# Patient Record
Sex: Female | Born: 1955 | Race: White | Hispanic: No | Marital: Single | Smoking: Never smoker
Health system: Southern US, Community
[De-identification: ages and names within clinical notes are randomized; demographics above are authoritative.]

## PROBLEM LIST (undated history)

## (undated) DIAGNOSIS — B019 Varicella without complication: Secondary | ICD-10-CM

## (undated) HISTORY — PX: TONSILLECTOMY: SUR1361

## (undated) HISTORY — PX: RECONSTRUCTION OF EYELID: SHX6576

## (undated) HISTORY — DX: Varicella without complication: B01.9

---

## 2009-03-27 LAB — HM PAP SMEAR: HM Pap smear: NORMAL

## 2009-03-27 LAB — HM MAMMOGRAPHY: HM Mammogram: NORMAL

## 2014-11-03 ENCOUNTER — Encounter: Payer: Self-pay | Admitting: *Deleted

## 2014-11-03 ENCOUNTER — Telehealth: Payer: Self-pay | Admitting: *Deleted

## 2014-11-03 NOTE — Telephone Encounter (Signed)
Pre-Visit Call completed with patient and chart updated.   Pre-Visit Info documented in Specialty Comments under [employer].    

## 2014-11-04 ENCOUNTER — Ambulatory Visit (INDEPENDENT_AMBULATORY_CARE_PROVIDER_SITE_OTHER): Payer: BLUE CROSS/BLUE SHIELD | Admitting: Physician Assistant

## 2014-11-04 ENCOUNTER — Encounter: Payer: Self-pay | Admitting: Physician Assistant

## 2014-11-04 VITALS — BP 126/64 | HR 76 | Temp 98.0°F | Resp 16 | Ht 65.0 in | Wt 140.2 lb

## 2014-11-04 DIAGNOSIS — Z0000 Encounter for general adult medical examination without abnormal findings: Secondary | ICD-10-CM | POA: Diagnosis not present

## 2014-11-04 DIAGNOSIS — Z23 Encounter for immunization: Secondary | ICD-10-CM | POA: Diagnosis not present

## 2014-11-04 DIAGNOSIS — Z1239 Encounter for other screening for malignant neoplasm of breast: Secondary | ICD-10-CM | POA: Insufficient documentation

## 2014-11-04 NOTE — Progress Notes (Signed)
Patient presents to clinic today to establish care. Is requesting CPE. Is fasting for labs.  Patient is followed by Dr. Truddie Crumble with [facility]. Endorses last PAP > 5 years without abnormal findings. Last Mammogram > 5 years ago. Endorses was normal. Has + family history of breast cancer with mother diagnosed in her 58s.  Endorses last Colonoscopy > 10 years ago without abnormal findings without abnormal findings.   Is followed by Optometry for annual eye exams. Wears contacts daily. Is up to date on dental care.  Is due for tetanus. Will be getting today.  Past Medical History  Diagnosis Date  . Chicken pox   . UTI (lower urinary tract infection)     Current Outpatient Prescriptions on File Prior to Visit  Medication Sig Dispense Refill  . cholecalciferol (VITAMIN D) 1000 UNITS tablet Take 2,000 Units by mouth daily.    . Cod Liver Oil OIL Take 1 Dose by mouth daily. 1 tsp daily     No current facility-administered medications on file prior to visit.    No Known Allergies  Family History  Problem Relation Age of Onset  . Hypertension Mother   . Cancer Mother 7    breast   . Heart disease Mother   . Hypertension Father   . Heart disease Father     Social History   Social History  . Marital Status: Single    Spouse Name: N/A  . Number of Children: N/A  . Years of Education: N/A   Social History Main Topics  . Smoking status: None  . Smokeless tobacco: None  . Alcohol Use: None  . Drug Use: None  . Sexual Activity: Not Asked   Other Topics Concern  . None   Social History Narrative   Review of Systems  Constitutional: Negative for fever and weight loss.  HENT: Negative for ear discharge, ear pain, hearing loss and tinnitus.   Eyes: Negative for blurred vision, double vision, photophobia and pain.  Respiratory: Negative for cough and shortness of breath.   Cardiovascular: Negative for chest pain and palpitations.  Gastrointestinal: Negative for  heartburn, nausea, vomiting, abdominal pain, diarrhea, constipation, blood in stool and melena.  Genitourinary: Negative for dysuria, urgency, frequency, hematuria and flank pain.  Musculoskeletal: Negative for falls.  Neurological: Negative for dizziness, loss of consciousness and headaches.  Endo/Heme/Allergies: Negative for environmental allergies.  Psychiatric/Behavioral: Negative for depression, suicidal ideas, hallucinations and substance abuse. The patient is not nervous/anxious and does not have insomnia.      BP 126/64 mmHg  Pulse 76  Temp(Src) 98 F (36.7 C) (Oral)  Resp 16  Ht  (1.651 m)  Wt 140 lb 3.2 oz (63.594 kg)  BMI 23.33 kg/m2  SpO2 98%  Physical Exam  Constitutional: She is oriented to person, place, and time and well-developed, well-nourished, and in no distress.  HENT:  Head: Normocephalic and atraumatic.  Right Ear: Tympanic membrane, external ear and ear canal normal.  Left Ear: Tympanic membrane, external ear and ear canal normal.  Nose: Nose normal. No mucosal edema.  Mouth/Throat: Uvula is midline, oropharynx is clear and moist and mucous membranes are normal. No oropharyngeal exudate or posterior oropharyngeal erythema.  Eyes: Conjunctivae are normal. Pupils are equal, round, and reactive to light.  Neck: Neck supple. No thyromegaly present.  Cardiovascular: Normal rate, regular rhythm, normal heart sounds and intact distal pulses.   Pulmonary/Chest: Effort normal and breath sounds normal. No respiratory distress. She has no wheezes. She has  no rales.  Abdominal: Soft. Bowel sounds are normal. She exhibits no distension and no mass. There is no tenderness. There is no rebound and no guarding.  Lymphadenopathy:    She has no cervical adenopathy.  Neurological: She is alert and oriented to person, place, and time. No cranial nerve deficit.  Skin: Skin is warm and dry. No rash noted.  Psychiatric: Affect normal.  Vitals  reviewed.  Assessment/Plan: Breast cancer screening Order for screening mammogram placed in patient with + family hx of breast cancer.  Visit for preventive health examination Depression screen negative. Health Maintenance reviewed -- Overdue for Tetanus, PAP, Mammogram and Colonoscopy. Will prioritize completing these. TDaP updated. Mammogram ordered. Patient to schedule visit for pelvic exam and PAP. Will then proceed with Colonoscopy. Preventive schedule discussed and handout given in AVS. Will obtain fasting labs today.  Need for Tdap vaccination TDaP given by nursing staff.

## 2014-11-04 NOTE — Patient Instructions (Signed)
Please go to the lab for blood work. I will call you with your results. If all is good we will follow-up yearly for physicals and whenever you need me. If anything is abnormal we will treat accordingly and get you in for a follow-up visit.  Your Tetanus is good for 10 years. You will be contacted for a screening mammogram.  Please schedule an appointment for routine breast/pelvic exam with a PAP smear. I can always refer you to gynecology for this if you prefer.  Our goal will to also update your colonoscopy this year. Please give some thought to these and let me know when you are ready to schedule.  Welcome to [facility]!  Preventive Care for Adults A healthy lifestyle and preventive care can promote health and wellness. Preventive health guidelines for women include the following key practices.  A routine yearly physical is a good way to check with your health care provider about your health and preventive screening. It is a chance to share any concerns and updates on your health and to receive a thorough exam.  Visit your dentist for a routine exam and preventive care every 6 months. Brush your teeth twice a day and floss once a day. Good oral hygiene prevents tooth decay and gum disease.  The frequency of eye exams is based on your age, health, family medical history, use of contact lenses, and other factors. Follow your health care provider's recommendations for frequency of eye exams.  Eat a healthy diet. Foods like vegetables, fruits, whole grains, low-fat dairy products, and lean protein foods contain the nutrients you need without too many calories. Decrease your intake of foods high in solid fats, added sugars, and salt. Eat the right amount of calories for you.Get information about a proper diet from your health care provider, if necessary.  Regular physical exercise is one of the most important things you can do for your health. Most adults should get at least 150 minutes of  moderate-intensity exercise (any activity that increases your heart rate and causes you to sweat) each week. In addition, most adults need muscle-strengthening exercises on 2 or more days a week.  Maintain a healthy weight. The body mass index (BMI) is a screening tool to identify possible weight problems. It provides an estimate of body fat based on height and weight. Your health care provider can find your BMI and can help you achieve or maintain a healthy weight.For adults 20 years and older:  A BMI below 18.5 is considered underweight.  A BMI of 18.5 to 24.9 is normal.  A BMI of 25 to 29.9 is considered overweight.  A BMI of 30 and above is considered obese.  Maintain normal blood lipids and cholesterol levels by exercising and minimizing your intake of saturated fat. Eat a balanced diet with plenty of fruit and vegetables. Blood tests for lipids and cholesterol should begin at age 39 and be repeated every 5 years. If your lipid or cholesterol levels are high, you are over 50, or you are at high risk for heart disease, you may need your cholesterol levels checked more frequently.Ongoing high lipid and cholesterol levels should be treated with medicines if diet and exercise are not working.  If you smoke, find out from your health care provider how to quit. If you do not use tobacco, do not start.  Lung cancer screening is recommended for adults aged 93-80 years who are at high risk for developing lung cancer because of a history of smoking.  A yearly low-dose CT scan of the lungs is recommended for people who have at least a 30-pack-year history of smoking and are a current smoker or have quit within the past 15 years. A pack year of smoking is smoking an average of 1 pack of cigarettes a day for 1 year (for example: 1 pack a day for 30 years or 2 packs a day for 15 years). Yearly screening should continue until the smoker has stopped smoking for at least 15 years. Yearly screening should be  stopped for people who develop a health problem that would prevent them from having lung cancer treatment.  If you are pregnant, do not drink alcohol. If you are breastfeeding, be very cautious about drinking alcohol. If you are not pregnant and choose to drink alcohol, do not have more than 1 drink per day. One drink is considered to be 12 ounces (355 mL) of beer, 5 ounces (148 mL) of wine, or 1.5 ounces (44 mL) of liquor.  Avoid use of street drugs. Do not share needles with anyone. Ask for help if you need support or instructions about stopping the use of drugs.  High blood pressure causes heart disease and increases the risk of stroke. Your blood pressure should be checked at least every 1 to 2 years. Ongoing high blood pressure should be treated with medicines if weight loss and exercise do not work.  If you are 54-89 years old, ask your health care provider if you should take aspirin to prevent strokes.  Diabetes screening involves taking a blood sample to check your fasting blood sugar level. This should be done once every 3 years, after age 38, if you are within normal weight and without risk factors for diabetes. Testing should be considered at a younger age or be carried out more frequently if you are overweight and have at least 1 risk factor for diabetes.  Breast cancer screening is essential preventive care for women. You should practice "breast self-awareness." This means understanding the normal appearance and feel of your breasts and may include breast self-examination. Any changes detected, no matter how small, should be reported to a health care provider. Women in their 4s and 30s should have a clinical breast exam (CBE) by a health care provider as part of a regular health exam every 1 to 3 years. After age 43, women should have a CBE every year. Starting at age 75, women should consider having a mammogram (breast X-ray test) every year. Women who have a family history of breast  cancer should talk to their health care provider about genetic screening. Women at a high risk of breast cancer should talk to their health care providers about having an MRI and a mammogram every year.  Breast cancer gene (BRCA)-related cancer risk assessment is recommended for women who have family members with BRCA-related cancers. BRCA-related cancers include breast, ovarian, tubal, and peritoneal cancers. Having family members with these cancers may be associated with an increased risk for harmful changes (mutations) in the breast cancer genes BRCA1 and BRCA2. Results of the assessment will determine the need for genetic counseling and BRCA1 and BRCA2 testing.  Routine pelvic exams to screen for cancer are no longer recommended for nonpregnant women who are considered low risk for cancer of the pelvic organs (ovaries, uterus, and vagina) and who do not have symptoms. Ask your health care provider if a screening pelvic exam is right for you.  If you have had past treatment for cervical cancer or  a condition that could lead to cancer, you need Pap tests and screening for cancer for at least 20 years after your treatment. If Pap tests have been discontinued, your risk factors (such as having a new sexual partner) need to be reassessed to determine if screening should be resumed. Some women have medical problems that increase the chance of getting cervical cancer. In these cases, your health care provider may recommend more frequent screening and Pap tests.  The HPV test is an additional test that may be used for cervical cancer screening. The HPV test looks for the virus that can cause the cell changes on the cervix. The cells collected during the Pap test can be tested for HPV. The HPV test could be used to screen women aged 69 years and older, and should be used in women of any age who have unclear Pap test results. After the age of 106, women should have HPV testing at the same frequency as a Pap  test.  Colorectal cancer can be detected and often prevented. Most routine colorectal cancer screening begins at the age of 69 years and continues through age 36 years. However, your health care provider may recommend screening at an earlier age if you have risk factors for colon cancer. On a yearly basis, your health care provider may provide home test kits to check for hidden blood in the stool. Use of a small camera at the end of a tube, to directly examine the colon (sigmoidoscopy or colonoscopy), can detect the earliest forms of colorectal cancer. Talk to your health care provider about this at age 69, when routine screening begins. Direct exam of the colon should be repeated every 5-10 years through age 77 years, unless early forms of pre-cancerous polyps or small growths are found.  People who are at an increased risk for hepatitis B should be screened for this virus. You are considered at high risk for hepatitis B if:  You were born in a country where hepatitis B occurs often. Talk with your health care provider about which countries are considered high risk.  Your parents were born in a high-risk country and you have not received a shot to protect against hepatitis B (hepatitis B vaccine).  You have HIV or AIDS.  You use needles to inject street drugs.  You live with, or have sex with, someone who has hepatitis B.  You get hemodialysis treatment.  You take certain medicines for conditions like cancer, organ transplantation, and autoimmune conditions.  Hepatitis C blood testing is recommended for all people born from [birth year] through 1965 and any individual with known risks for hepatitis C.  Practice safe sex. Use condoms and avoid high-risk sexual practices to reduce the spread of sexually transmitted infections (STIs). STIs include gonorrhea, chlamydia, syphilis, trichomonas, herpes, HPV, and human immunodeficiency virus (HIV). Herpes, HIV, and HPV are viral illnesses that have no cure.  They can result in disability, cancer, and death.  You should be screened for sexually transmitted illnesses (STIs) including gonorrhea and chlamydia if:  You are sexually active and are younger than 24 years.  You are older than 24 years and your health care provider tells you that you are at risk for this type of infection.  Your sexual activity has changed since you were last screened and you are at an increased risk for chlamydia or gonorrhea. Ask your health care provider if you are at risk.  If you are at risk of being infected with HIV, it is  recommended that you take a prescription medicine daily to prevent HIV infection. This is called preexposure prophylaxis (PrEP). You are considered at risk if:  You are a heterosexual woman, are sexually active, and are at increased risk for HIV infection.  You take drugs by injection.  You are sexually active with a partner who has HIV.  Talk with your health care provider about whether you are at high risk of being infected with HIV. If you choose to begin PrEP, you should first be tested for HIV. You should then be tested every 3 months for as long as you are taking PrEP.  Osteoporosis is a disease in which the bones lose minerals and strength with aging. This can result in serious bone fractures or breaks. The risk of osteoporosis can be identified using a bone density scan. Women ages 84 years and over and women at risk for fractures or osteoporosis should discuss screening with their health care providers. Ask your health care provider whether you should take a calcium supplement or vitamin D to reduce the rate of osteoporosis.  Menopause can be associated with physical symptoms and risks. Hormone replacement therapy is available to decrease symptoms and risks. You should talk to your health care provider about whether hormone replacement therapy is right for you.  Use sunscreen. Apply sunscreen liberally and repeatedly throughout the day.  You should seek shade when your shadow is shorter than you. Protect yourself by wearing long sleeves, pants, a wide-brimmed hat, and sunglasses year round, whenever you are outdoors.  Once a month, do a whole body skin exam, using a mirror to look at the skin on your back. Tell your health care provider of new moles, moles that have irregular borders, moles that are larger than a pencil eraser, or moles that have changed in shape or color.  Stay current with required vaccines (immunizations).  Influenza vaccine. All adults should be immunized every year.  Tetanus, diphtheria, and acellular pertussis (Td, Tdap) vaccine. Pregnant women should receive 1 dose of Tdap vaccine during each pregnancy. The dose should be obtained regardless of the length of time since the last dose. Immunization is preferred during the 27th-36th week of gestation. An adult who has not previously received Tdap or who does not know her vaccine status should receive 1 dose of Tdap. This initial dose should be followed by tetanus and diphtheria toxoids (Td) booster doses every 10 years. Adults with an unknown or incomplete history of completing a 3-dose immunization series with Td-containing vaccines should begin or complete a primary immunization series including a Tdap dose. Adults should receive a Td booster every 10 years.  Varicella vaccine. An adult without evidence of immunity to varicella should receive 2 doses or a second dose if she has previously received 1 dose. Pregnant females who do not have evidence of immunity should receive the first dose after pregnancy. This first dose should be obtained before leaving the health care facility. The second dose should be obtained 4-8 weeks after the first dose.  Human papillomavirus (HPV) vaccine. Females aged 13-26 years who have not received the vaccine previously should obtain the 3-dose series. The vaccine is not recommended for use in pregnant females. However, pregnancy  testing is not needed before receiving a dose. If a female is found to be pregnant after receiving a dose, no treatment is needed. In that case, the remaining doses should be delayed until after the pregnancy. Immunization is recommended for any person with an immunocompromised condition through  the age of 29 years if she did not get any or all doses earlier. During the 3-dose series, the second dose should be obtained 4-8 weeks after the first dose. The third dose should be obtained 24 weeks after the first dose and 16 weeks after the second dose.  Zoster vaccine. One dose is recommended for adults aged 37 years or older unless certain conditions are present.  Measles, mumps, and rubella (MMR) vaccine. Adults born before [birth year] generally are considered immune to measles and mumps. Adults born in [birth year] or later should have 1 or more doses of MMR vaccine unless there is a contraindication to the vaccine or there is laboratory evidence of immunity to each of the three diseases. A routine second dose of MMR vaccine should be obtained at least 28 days after the first dose for students attending postsecondary schools, health care workers, or international travelers. People who received inactivated measles vaccine or an unknown type of measles vaccine during 1963-1967 should receive 2 doses of MMR vaccine. People who received inactivated mumps vaccine or an unknown type of mumps vaccine before 1979 and are at high risk for mumps infection should consider immunization with 2 doses of MMR vaccine. For females of childbearing age, rubella immunity should be determined. If there is no evidence of immunity, females who are not pregnant should be vaccinated. If there is no evidence of immunity, females who are pregnant should delay immunization until after pregnancy. Unvaccinated health care workers born before [birth year] who lack laboratory evidence of measles, mumps, or rubella immunity or laboratory confirmation of disease should  consider measles and mumps immunization with 2 doses of MMR vaccine or rubella immunization with 1 dose of MMR vaccine.  Pneumococcal 13-valent conjugate (PCV13) vaccine. When indicated, a person who is uncertain of her immunization history and has no record of immunization should receive the PCV13 vaccine. An adult aged 75 years or older who has certain medical conditions and has not been previously immunized should receive 1 dose of PCV13 vaccine. This PCV13 should be followed with a dose of pneumococcal polysaccharide (PPSV23) vaccine. The PPSV23 vaccine dose should be obtained at least 8 weeks after the dose of PCV13 vaccine. An adult aged 13 years or older who has certain medical conditions and previously received 1 or more doses of PPSV23 vaccine should receive 1 dose of PCV13. The PCV13 vaccine dose should be obtained 1 or more years after the last PPSV23 vaccine dose.  Pneumococcal polysaccharide (PPSV23) vaccine. When PCV13 is also indicated, PCV13 should be obtained first. All adults aged 5 years and older should be immunized. An adult younger than age 8 years who has certain medical conditions should be immunized. Any person who resides in a nursing home or long-term care facility should be immunized. An adult smoker should be immunized. People with an immunocompromised condition and certain other conditions should receive both PCV13 and PPSV23 vaccines. People with human immunodeficiency virus (HIV) infection should be immunized as soon as possible after diagnosis. Immunization during chemotherapy or radiation therapy should be avoided. Routine use of PPSV23 vaccine is not recommended for American Indians, [place] Natives, or people younger than 65 years unless there are medical conditions that require PPSV23 vaccine. When indicated, people who have unknown immunization and have no record of immunization should receive PPSV23 vaccine. One-time revaccination 5 years after the first dose of PPSV23 is  recommended for people aged 19-64 years who have chronic kidney failure, nephrotic syndrome, asplenia, or immunocompromised conditions. People who received  1-2 doses of PPSV23 before age 54 years should receive another dose of PPSV23 vaccine at age 70 years or later if at least 5 years have passed since the previous dose. Doses of PPSV23 are not needed for people immunized with PPSV23 at or after age 56 years.  Meningococcal vaccine. Adults with asplenia or persistent complement component deficiencies should receive 2 doses of quadrivalent meningococcal conjugate (MenACWY-D) vaccine. The doses should be obtained at least 2 months apart. Microbiologists working with certain meningococcal bacteria, [employer] recruits, people at risk during an outbreak, and people who travel to or live in countries with a high rate of meningitis should be immunized. A first-year college student up through age 27 years who is living in a residence hall should receive a dose if she did not receive a dose on or after her 16th birthday. Adults who have certain high-risk conditions should receive one or more doses of vaccine.  Hepatitis A vaccine. Adults who wish to be protected from this disease, have certain high-risk conditions, work with hepatitis A-infected animals, work in hepatitis A research labs, or travel to or work in countries with a high rate of hepatitis A should be immunized. Adults who were previously unvaccinated and who anticipate close contact with an international adoptee during the first 60 days after arrival in the [place] from a country with a high rate of hepatitis A should be immunized.  Hepatitis B vaccine. Adults who wish to be protected from this disease, have certain high-risk conditions, may be exposed to blood or other infectious body fluids, are household contacts or sex partners of hepatitis B positive people, are clients or workers in certain care facilities, or travel to or work in countries  with a high rate of hepatitis B should be immunized.  Haemophilus influenzae type b (Hib) vaccine. A previously unvaccinated person with asplenia or sickle cell disease or having a scheduled splenectomy should receive 1 dose of Hib vaccine. Regardless of previous immunization, a recipient of a hematopoietic stem cell transplant should receive a 3-dose series 6-12 months after her successful transplant. Hib vaccine is not recommended for adults with HIV infection. Preventive Services / Frequency Ages 62 to 54 years  Blood pressure check.** / Every 1 to 2 years.  Lipid and cholesterol check.** / Every 5 years beginning at age 66.  Clinical breast exam.** / Every 3 years for women in their 76s and 82s.  BRCA-related cancer risk assessment.** / For women who have family members with a BRCA-related cancer (breast, ovarian, tubal, or peritoneal cancers).  Pap test.** / Every 2 years from ages 31 through 32. Every 3 years starting at age 50 through age 76 or 50 with a history of 3 consecutive normal Pap tests.  HPV screening.** / Every 3 years from ages 43 through ages 64 to 80 with a history of 3 consecutive normal Pap tests.  Hepatitis C blood test.** / For any individual with known risks for hepatitis C.  Skin self-exam. / Monthly.  Influenza vaccine. / Every year.  Tetanus, diphtheria, and acellular pertussis (Tdap, Td) vaccine.** / Consult your health care provider. Pregnant women should receive 1 dose of Tdap vaccine during each pregnancy. 1 dose of Td every 10 years.  Varicella vaccine.** / Consult your health care provider. Pregnant females who do not have evidence of immunity should receive the first dose after pregnancy.  HPV vaccine. / 3 doses over 6 months, if 66 and younger. The vaccine is not recommended for use in  pregnant females. However, pregnancy testing is not needed before receiving a dose.  Measles, mumps, rubella (MMR) vaccine.** / You need at least 1 dose of MMR if you  were born in 1957 or later. You may also need a 2nd dose. For females of childbearing age, rubella immunity should be determined. If there is no evidence of immunity, females who are not pregnant should be vaccinated. If there is no evidence of immunity, females who are pregnant should delay immunization until after pregnancy.  Pneumococcal 13-valent conjugate (PCV13) vaccine.** / Consult your health care provider.  Pneumococcal polysaccharide (PPSV23) vaccine.** / 1 to 2 doses if you smoke cigarettes or if you have certain conditions.  Meningococcal vaccine.** / 1 dose if you are age 18 to 52 years and a Market researcher living in a residence hall, or have one of several medical conditions, you need to get vaccinated against meningococcal disease. You may also need additional booster doses.  Hepatitis A vaccine.** / Consult your health care provider.  Hepatitis B vaccine.** / Consult your health care provider.  Haemophilus influenzae type b (Hib) vaccine.** / Consult your health care provider. Ages 57 to 89 years  Blood pressure check.** / Every 1 to 2 years.  Lipid and cholesterol check.** / Every 5 years beginning at age 73 years.  Lung cancer screening. / Every year if you are aged 30-80 years and have a 30-pack-year history of smoking and currently smoke or have quit within the past 15 years. Yearly screening is stopped once you have quit smoking for at least 15 years or develop a health problem that would prevent you from having lung cancer treatment.  Clinical breast exam.** / Every year after age 40 years.  BRCA-related cancer risk assessment.** / For women who have family members with a BRCA-related cancer (breast, ovarian, tubal, or peritoneal cancers).  Mammogram.** / Every year beginning at age 54 years and continuing for as long as you are in good health. Consult with your health care provider.  Pap test.** / Every 3 years starting at age 29 years through age 71 or  65 years with a history of 3 consecutive normal Pap tests.  HPV screening.** / Every 3 years from ages 49 years through ages 31 to 64 years with a history of 3 consecutive normal Pap tests.  Fecal occult blood test (FOBT) of stool. / Every year beginning at age 50 years and continuing until age 51 years. You may not need to do this test if you get a colonoscopy every 10 years.  Flexible sigmoidoscopy or colonoscopy.** / Every 5 years for a flexible sigmoidoscopy or every 10 years for a colonoscopy beginning at age 28 years and continuing until age 50 years.  Hepatitis C blood test.** / For all people born from [birth year] through 1963 and any individual with known risks for hepatitis C.  Skin self-exam. / Monthly.  Influenza vaccine. / Every year.  Tetanus, diphtheria, and acellular pertussis (Tdap/Td) vaccine.** / Consult your health care provider. Pregnant women should receive 1 dose of Tdap vaccine during each pregnancy. 1 dose of Td every 10 years.  Varicella vaccine.** / Consult your health care provider. Pregnant females who do not have evidence of immunity should receive the first dose after pregnancy.  Zoster vaccine.** / 1 dose for adults aged 30 years or older.  Measles, mumps, rubella (MMR) vaccine.** / You need at least 1 dose of MMR if you were born in 1957 or later. You may also need a  2nd dose. For females of childbearing age, rubella immunity should be determined. If there is no evidence of immunity, females who are not pregnant should be vaccinated. If there is no evidence of immunity, females who are pregnant should delay immunization until after pregnancy.  Pneumococcal 13-valent conjugate (PCV13) vaccine.** / Consult your health care provider.  Pneumococcal polysaccharide (PPSV23) vaccine.** / 1 to 2 doses if you smoke cigarettes or if you have certain conditions.  Meningococcal vaccine.** / Consult your health care provider.  Hepatitis A vaccine.** / Consult your health care  provider.  Hepatitis B vaccine.** / Consult your health care provider.  Haemophilus influenzae type b (Hib) vaccine.** / Consult your health care provider. Ages 5 years and over  Blood pressure check.** / Every 1 to 2 years.  Lipid and cholesterol check.** / Every 5 years beginning at age 35 years.  Lung cancer screening. / Every year if you are aged 64-80 years and have a 30-pack-year history of smoking and currently smoke or have quit within the past 15 years. Yearly screening is stopped once you have quit smoking for at least 15 years or develop a health problem that would prevent you from having lung cancer treatment.  Clinical breast exam.** / Every year after age 7 years.  BRCA-related cancer risk assessment.** / For women who have family members with a BRCA-related cancer (breast, ovarian, tubal, or peritoneal cancers).  Mammogram.** / Every year beginning at age 1 years and continuing for as long as you are in good health. Consult with your health care provider.  Pap test.** / Every 3 years starting at age 103 years through age 4 or 21 years with 3 consecutive normal Pap tests. Testing can be stopped between 65 and 70 years with 3 consecutive normal Pap tests and no abnormal Pap or HPV tests in the past 10 years.  HPV screening.** / Every 3 years from ages 70 years through ages 76 or 24 years with a history of 3 consecutive normal Pap tests. Testing can be stopped between 65 and 70 years with 3 consecutive normal Pap tests and no abnormal Pap or HPV tests in the past 10 years.  Fecal occult blood test (FOBT) of stool. / Every year beginning at age 59 years and continuing until age 2 years. You may not need to do this test if you get a colonoscopy every 10 years.  Flexible sigmoidoscopy or colonoscopy.** / Every 5 years for a flexible sigmoidoscopy or every 10 years for a colonoscopy beginning at age 59 years and continuing until age 55 years.  Hepatitis C blood test.** / For  all people born from [birth year] through 1963 and any individual with known risks for hepatitis C.  Osteoporosis screening.** / A one-time screening for women ages 44 years and over and women at risk for fractures or osteoporosis.  Skin self-exam. / Monthly.  Influenza vaccine. / Every year.  Tetanus, diphtheria, and acellular pertussis (Tdap/Td) vaccine.** / 1 dose of Td every 10 years.  Varicella vaccine.** / Consult your health care provider.  Zoster vaccine.** / 1 dose for adults aged 52 years or older.  Pneumococcal 13-valent conjugate (PCV13) vaccine.** / Consult your health care provider.  Pneumococcal polysaccharide (PPSV23) vaccine.** / 1 dose for all adults aged 61 years and older.  Meningococcal vaccine.** / Consult your health care provider.  Hepatitis A vaccine.** / Consult your health care provider.  Hepatitis B vaccine.** / Consult your health care provider.  Haemophilus influenzae type b (Hib) vaccine.** /  Consult your health care provider. ** Family history and personal history of risk and conditions may change your health care provider's recommendations. Document Released: 05/09/2001 Document Revised: 07/28/2013 Document Reviewed: 08/08/2010 [facility] Patient Information 2015 [place], [place]. This information is not intended to replace advice given to you by your health care provider. Make sure you discuss any questions you have with your health care provider.

## 2014-11-04 NOTE — Progress Notes (Signed)
Pre visit review using our clinic review tool, if applicable. No additional management support is needed unless otherwise documented below in the visit note. 

## 2014-11-05 ENCOUNTER — Encounter: Payer: Self-pay | Admitting: Physician Assistant

## 2014-11-05 LAB — URINALYSIS, ROUTINE W REFLEX MICROSCOPIC
BILIRUBIN URINE: NEGATIVE
Ketones, ur: NEGATIVE
Nitrite: NEGATIVE
PH: 6 (ref 5.0–8.0)
TOTAL PROTEIN, URINE-UPE24: NEGATIVE
URINE GLUCOSE: NEGATIVE
Urobilinogen, UA: 0.2 (ref 0.0–1.0)

## 2014-11-05 LAB — BASIC METABOLIC PANEL
BUN: 10 mg/dL (ref 6–23)
CALCIUM: 10 mg/dL (ref 8.4–10.5)
CO2: 29 meq/L (ref 19–32)
CREATININE: 0.69 mg/dL (ref 0.40–1.20)
Chloride: 100 mEq/L (ref 96–112)
GFR: 93.19 mL/min (ref >60.00)
GLUCOSE: 74 mg/dL (ref 70–99)
Potassium: 3.7 mEq/L (ref 3.5–5.1)
Sodium: 139 mEq/L (ref 135–145)

## 2014-11-05 LAB — LIPID PANEL
CHOLESTEROL: 191 mg/dL (ref 0–200)
HDL: 49.6 mg/dL (ref >39.00)
LDL Cholesterol: 124 mg/dL — ABNORMAL HIGH (ref 0–99)
NonHDL: 141.45
TRIGLYCERIDES: 88 mg/dL (ref 0.0–149.0)
Total CHOL/HDL Ratio: 4
VLDL: 17.6 mg/dL (ref 0.0–40.0)

## 2014-11-05 LAB — HEPATIC FUNCTION PANEL
ALBUMIN: 4.6 g/dL (ref 3.5–5.2)
ALK PHOS: 60 U/L (ref 39–117)
ALT: 16 U/L (ref 0–35)
AST: 18 U/L (ref 0–37)
Bilirubin, Direct: 0.1 mg/dL (ref 0.0–0.3)
Total Bilirubin: 0.6 mg/dL (ref 0.2–1.2)
Total Protein: 7.3 g/dL (ref 6.0–8.3)

## 2014-11-05 LAB — CBC
HCT: 42.6 % (ref 36.0–46.0)
HEMOGLOBIN: 14.3 g/dL (ref 12.0–15.0)
MCHC: 33.5 g/dL (ref 30.0–36.0)
MCV: 90.6 fl (ref 78.0–100.0)
Platelets: 262 10*3/uL (ref 150.0–400.0)
RBC: 4.7 Mil/uL (ref 3.87–5.11)
RDW: 12.4 % (ref 11.5–15.5)
WBC: 6.9 10*3/uL (ref 4.0–10.5)

## 2014-11-05 LAB — TSH: TSH: 0.88 u[IU]/mL (ref 0.35–4.50)

## 2014-11-05 LAB — HEMOGLOBIN A1C: HEMOGLOBIN A1C: 5.3 % (ref 4.6–6.5)

## 2014-11-05 NOTE — Assessment & Plan Note (Signed)
Order for screening mammogram placed in patient with + family hx of breast cancer.

## 2014-11-05 NOTE — Assessment & Plan Note (Signed)
TDaP given by nursing staff. 

## 2014-11-05 NOTE — Assessment & Plan Note (Signed)
Depression screen negative. Health Maintenance reviewed -- Overdue for Tetanus, PAP, Mammogram and Colonoscopy. Will prioritize completing these. TDaP updated. Mammogram ordered. Patient to schedule visit for pelvic exam and PAP. Will then proceed with Colonoscopy. Preventive schedule discussed and handout given in AVS. Will obtain fasting labs today.

## 2014-11-13 ENCOUNTER — Telehealth: Payer: Self-pay | Admitting: Physician Assistant

## 2014-11-13 NOTE — Telephone Encounter (Signed)
-----   Message from Waldon Merl, PA-C sent at 11/06/2014  7:11 AM EDT ----- Labs are great overall. Cholesterol (LDL) is borderline high but nothing worrisome. We will keep an eye on it. Encourage her to stay active and to limit foods high in cholesterol and saturated fats. Reviewing her calculated risk for coronary artery disease/heart attack etc, she is at below average risk which is great. Would consider starting a 81 mg (baby) aspirin daily to reduce risk further. Her urine shows presence of some enzymes that are potentially associated with UTI, but there is no other sign of infection on urine test. If she is not having symptoms then nothing further is needed. If any symptoms are present, I would recommend she return for further urine testing. Otherwise follow-up yearly for CPE. I will call her once I have her mammogram results.

## 2014-11-13 NOTE — Telephone Encounter (Signed)
Pt returning your call. Best # [phone].

## 2014-11-13 NOTE — Telephone Encounter (Signed)
Spoke with the pt and informed her of recent lab results and note.  Pt verbalized understanding.  Pt stated that she noticed alittle urine discomfort and freq.  She said if the sxs persist she will call back next week and come in.//AB/CMA

## 2015-04-15 ENCOUNTER — Telehealth: Payer: Self-pay | Admitting: Physician Assistant

## 2015-04-15 NOTE — Telephone Encounter (Signed)
Caller name: Di-Ann  Relationship to patient: Self   Can be reached: [phone] --   Pharmacy: [facility] in [place]   Reason for call: Pt says that she is taking care of her sick mom that was just diagnosed with the flu. She says that her provider prescribed her mom Tamaflu . She says that the provider suggested her calling her PCP to see if he could prescribe her a preventative med. To hopefully prevent her from getting sick alone w/ mom.   Informed pt that pcp is out of office today. She expressed understanding.   Please advise.

## 2015-04-16 MED ORDER — OSELTAMIVIR PHOSPHATE 75 MG PO CAPS: 75.0000 mg | ORAL_CAPSULE | Freq: Every day | ORAL | Status: DC

## 2015-04-16 NOTE — Telephone Encounter (Signed)
Patient called to follow up. States that if Kathryn Price has another option than Tamiflu, she will be willing to try it, but if not she will take the Tamiflu knowing the possible side effects.

## 2015-04-16 NOTE — Telephone Encounter (Signed)
Called and spoke with the pt and informed her of the note below.  Pt verbalized understanding.  New rx sent to the pharmacy by e-script.//AB/CMA

## 2015-04-16 NOTE — Telephone Encounter (Signed)
Giving her significant risk I am willing to send in a medication. However I do want her to be aware that the Tamiflu has potential side effects -- nausea, vertigo, etc. If she is agreeable to medication, ok to send in Rx Tamiflu 75 mg once daily for 10 days.

## 2015-04-16 NOTE — Telephone Encounter (Signed)
Please advise.//AB/CMA 

## 2015-04-16 NOTE — Telephone Encounter (Signed)
No other medication option. Please send in script.

## 2015-08-09 DIAGNOSIS — M545 Low back pain: Secondary | ICD-10-CM | POA: Diagnosis not present

## 2015-08-09 DIAGNOSIS — M542 Cervicalgia: Secondary | ICD-10-CM | POA: Diagnosis not present

## 2015-08-09 DIAGNOSIS — M9903 Segmental and somatic dysfunction of lumbar region: Secondary | ICD-10-CM | POA: Diagnosis not present

## 2015-08-09 DIAGNOSIS — M797 Fibromyalgia: Secondary | ICD-10-CM | POA: Diagnosis not present

## 2015-10-11 DIAGNOSIS — M797 Fibromyalgia: Secondary | ICD-10-CM | POA: Diagnosis not present

## 2015-10-11 DIAGNOSIS — M545 Low back pain: Secondary | ICD-10-CM | POA: Diagnosis not present

## 2015-10-11 DIAGNOSIS — M9903 Segmental and somatic dysfunction of lumbar region: Secondary | ICD-10-CM | POA: Diagnosis not present

## 2015-10-11 DIAGNOSIS — M542 Cervicalgia: Secondary | ICD-10-CM | POA: Diagnosis not present

## 2015-12-10 DIAGNOSIS — L219 Seborrheic dermatitis, unspecified: Secondary | ICD-10-CM | POA: Diagnosis not present

## 2015-12-13 DIAGNOSIS — M5414 Radiculopathy, thoracic region: Secondary | ICD-10-CM | POA: Diagnosis not present

## 2015-12-13 DIAGNOSIS — M797 Fibromyalgia: Secondary | ICD-10-CM | POA: Diagnosis not present

## 2015-12-13 DIAGNOSIS — M9903 Segmental and somatic dysfunction of lumbar region: Secondary | ICD-10-CM | POA: Diagnosis not present

## 2015-12-13 DIAGNOSIS — M545 Low back pain: Secondary | ICD-10-CM | POA: Diagnosis not present

## 2016-04-26 DIAGNOSIS — J0130 Acute sphenoidal sinusitis, unspecified: Secondary | ICD-10-CM | POA: Diagnosis not present

## 2016-05-04 ENCOUNTER — Ambulatory Visit (INDEPENDENT_AMBULATORY_CARE_PROVIDER_SITE_OTHER): Payer: BLUE CROSS/BLUE SHIELD | Admitting: Medical

## 2016-05-04 ENCOUNTER — Telehealth: Payer: Self-pay | Admitting: Physician Assistant

## 2016-05-04 VITALS — BP 157/79 | HR 75 | Temp 98.1°F | Wt 141.6 lb

## 2016-05-04 DIAGNOSIS — J40 Bronchitis, not specified as acute or chronic: Secondary | ICD-10-CM | POA: Diagnosis not present

## 2016-05-04 DIAGNOSIS — R05 Cough: Secondary | ICD-10-CM

## 2016-05-04 DIAGNOSIS — R059 Cough, unspecified: Secondary | ICD-10-CM

## 2016-05-04 MED ORDER — HYDROCODONE-HOMATROPINE 5-1.5 MG/5ML PO SYRP: 5.0000 mL | ORAL_SOLUTION | Freq: Three times a day (TID) | ORAL | 0 refills | Status: DC | PRN

## 2016-05-04 MED ORDER — ALBUTEROL SULFATE HFA 108 (90 BASE) MCG/ACT IN AERS: 2.0000 | INHALATION_SPRAY | Freq: Four times a day (QID) | RESPIRATORY_TRACT | 0 refills | Status: DC | PRN

## 2016-05-04 MED ORDER — PREDNISONE 10 MG PO TABS: ORAL_TABLET | ORAL | 0 refills | Status: DC

## 2016-05-04 MED ORDER — AZITHROMYCIN 250 MG PO TABS: ORAL_TABLET | ORAL | 0 refills | Status: DC

## 2016-05-04 NOTE — Telephone Encounter (Signed)
Patient Name: Kathryn Price  DOB: [birth year]    Initial Comment caller states she has a cough, dry mouth and is taking Promethazine-DM and Cefdinir   Nurse Assessment  Nurse: Stefano GaulStringer, RN, Dwana CurdVera Date/Time (Eastern Time): 05/04/2016 1:18:04 PM  Confirm and document reason for call. If symptomatic, describe symptoms. ---Caller states she went to urgent care and is taking promethazine DM and Cefdinir for a cough and a sinus infection. Has a dry hacking cough and is coughing until she gags. No fever. Has 3 days left on her antibiotics.  Does the patient have any new or worsening symptoms? ---Yes  Will a triage be completed? ---Yes  Related visit to physician within the last 2 weeks? ---Yes  Does the PT have any chronic conditions? (i.e. diabetes, asthma, etc.) ---No  Is this a behavioral health or substance abuse call? ---No     Guidelines    Guideline Title Affirmed Question Affirmed Notes  Sinus Infection on Antibiotic Follow-up Call [1] Reasonable improvement on antibiotic AND [2] no fever or pain (all triage questions negative)   Cough - Acute Non-Productive SEVERE coughing spells (e.g., whooping sound after coughing, vomiting after coughing)    Final Disposition User   See Physician within 24 Hours ToyahStringer, RN, Dwana CurdVera    Comments  pt scheduled for 05/04/2016 with Esperanza RichtersEdward Saguier at 6:15 pm   Referrals  REFERRED TO PCP OFFICE   Disagree/Comply: Comply    Disagree/Comply: Comply

## 2016-05-04 NOTE — Progress Notes (Signed)
Subjective:    Patient ID: Kathryn Price, female    DOB: 1955/09/26, 61 y.o.   MRN: [id number]  HPI  Pt had 2 weeks of cough. Cough is not improving. Raspy voice. Pt went to urgent care one week ago. She got steroid injection. Not cone related UC. Not in epic. Pt got cefdnir and later phenergan cough based syrup. He cough has not improved at all.   Pt denies any wheezing. No hx of asthma. No inhaler use in the past when sick.     Review of Systems  Constitutional: Negative for chills, fatigue and fever.  HENT: Negative for congestion, ear pain, sinus pain, sinus pressure, sneezing and sore throat.   Respiratory: Positive for cough. Negative for shortness of breath and wheezing.        Chest congestion.  Cardiovascular: Negative for chest pain.  Gastrointestinal: Negative for abdominal pain.  Genitourinary: Negative for dysuria.  Musculoskeletal: Negative for back pain and myalgias.  Skin: Negative for rash.  Neurological: Negative for dizziness and headaches.  Hematological: Negative for adenopathy. Does not bruise/bleed easily.  Psychiatric/Behavioral: Negative for behavioral problems and confusion.    Past Medical History:  Diagnosis Date  . Chicken pox   . UTI (lower urinary tract infection)      Social History   Social History  . Marital status: Single    Spouse name: N/A  . Number of children: N/A  . Years of education: N/A   Occupational History  . Not on file.   Social History Main Topics  . Smoking status: Never Smoker  . Smokeless tobacco: Never Used  . Alcohol use No  . Drug use: No  . Sexual activity: Not on file   Other Topics Concern  . Not on file   Social History Narrative  . No narrative on file    Past Surgical History:  Procedure Laterality Date  . RECONSTRUCTION OF EYELID     cosmetic  . TONSILLECTOMY      Family History  Problem Relation Age of Onset  . Hypertension Mother   . Cancer Mother 88    breast   . Heart disease  Mother   . Hypertension Father   . Heart disease Father     No Known Allergies  Current Outpatient Prescriptions on File Prior to Visit  Medication Sig Dispense Refill  . cholecalciferol (VITAMIN D) 1000 UNITS tablet Take 2,000 Units by mouth daily.    . Cod Liver Oil OIL Take 1 Dose by mouth daily. 1 tsp daily    . oseltamivir (TAMIFLU) 75 MG capsule Take 1 capsule (75 mg total) by mouth daily. 10 capsule 0   No current facility-administered medications on file prior to visit.     BP (!) 157/79 (BP Location: Left Arm, Patient Position: Sitting, Cuff Size: Normal)   Pulse 75   Temp 98.1 F (36.7 C) (Oral)   Wt 141 lb 9.6 oz (64.2 kg)   SpO2 100%   BMI 23.56 kg/m       Objective:   Physical Exam   General  Mental Status - Alert. General Appearance - Well groomed. Not in acute distress.  Skin Rashes- No Rashes.  HEENT Head- Normal. Ear Auditory Canal - Left- Normal. Right - Normal.Tympanic Membrane- Left- Normal. Right- Normal. Eye Sclera/Conjunctiva- Left- Normal. Right- Normal. Nose & Sinuses Nasal Mucosa- Left-  Boggy and Congested. Right-  Boggy and  Congested.Bilateral no  maxillary and no frontal sinus pressure. Mouth & Throat Lips: Upper  Lip- Normal: no dryness, cracking, pallor, cyanosis, or vesicular eruption. Lower Lip-Normal: no dryness, cracking, pallor, cyanosis or vesicular eruption. Buccal Mucosa- Bilateral- No Aphthous ulcers. Oropharynx- No Discharge or Erythema. Tonsils: Characteristics- Bilateral- No Erythema or Congestion. Size/Enlargement- Bilateral- No enlargement. Discharge- bilateral-None.  Neck Neck- Supple. No Masses.   Chest and Lung Exam Auscultation: Breath Sounds:-Clear even and unlabored.  Cardiovascular Auscultation:Rythm- Regular, rate and rhythm. Murmurs & Other Heart Sounds:Ausculatation of the heart reveal- No Murmurs.  Lymphatic Head & Neck General Head & Neck Lymphatics: Bilateral: Description- No Localized  lymphadenopathy.      Assessment & Plan:  You appear to have bronchitis. Rest hydrate and tylenol for fever. I am prescribing cough medicine hycodan, and azithromycin antibiotic.   Recommend cxr tomorrow or at very latest on Monday.  Albuterol inhaler for wheezing, sob or constant cough( despite hycodan.)  Prednisone taper dose use if needed over weekend as instructed.  Follow up in 7-10 days or as needed  Arlie Riker, Ramon DredgeEdward, VF CorporationPA-C

## 2016-05-04 NOTE — Progress Notes (Signed)
Pre visit review using our clinic review tool, if applicable. No additional management support is needed unless otherwise documented below in the visit note. 

## 2016-05-04 NOTE — Patient Instructions (Addendum)
You appear to have bronchitis. Rest hydrate and tylenol for fever. I am prescribing cough medicine hycodan, and azithromycin antibiotic.   Recommend cxr tomorrow or at very latest on Monday.  Albuterol inhaler for wheezing, sob or constant cough( despite hycodan.)  Prednisone taper dose use if needed over weekend as instructed.  Follow up in 7-10 days or as needed

## 2016-05-04 NOTE — Telephone Encounter (Signed)
Patient called stating that she needed to speak with a physician to find out if she needs to be seen for her cold. She did not want to give me any further information just wanted to speak to a provider. Patient was informed that providers were seeing patient's currently. I sent her to team health to be triaged.

## 2016-05-04 NOTE — Telephone Encounter (Signed)
LMOVM advising patient to call back to give current symptoms to determine if need to be seen. Kathryn Price is out of the office today will return tomorrow.

## 2016-05-05 NOTE — Telephone Encounter (Signed)
Pt was seen by Ramon DredgeEdward on 05/04/16.

## 2016-05-05 NOTE — Telephone Encounter (Signed)
LMOVM advising patient to give details of symptoms if need to be seen for possible cold symptoms

## 2016-05-12 ENCOUNTER — Ambulatory Visit (HOSPITAL_BASED_OUTPATIENT_CLINIC_OR_DEPARTMENT_OTHER)
Admission: RE | Admit: 2016-05-12 | Discharge: 2016-05-12 | Disposition: A | Discharge status: Home / Self Care | Payer: BLUE CROSS/BLUE SHIELD | Source: Ambulatory Visit | Attending: Medical | Admitting: Medical

## 2016-05-12 DIAGNOSIS — R0989 Other specified symptoms and signs involving the circulatory and respiratory systems: Secondary | ICD-10-CM | POA: Diagnosis not present

## 2016-05-12 DIAGNOSIS — J40 Bronchitis, not specified as acute or chronic: Secondary | ICD-10-CM

## 2016-05-12 DIAGNOSIS — R059 Cough, unspecified: Secondary | ICD-10-CM

## 2016-05-12 DIAGNOSIS — R05 Cough: Secondary | ICD-10-CM | POA: Diagnosis not present

## 2016-11-22 ENCOUNTER — Telehealth: Payer: Self-pay | Admitting: Emergency Medicine

## 2016-11-22 NOTE — Telephone Encounter (Signed)
Called patient to schedule a CPE.  She wanted to transfer care from [place] Is ok to transfer?

## 2016-11-23 NOTE — Telephone Encounter (Signed)
Fine with me. Have not seen patient since 2016.

## 2016-11-29 NOTE — Telephone Encounter (Signed)
Kathryn CortiHey Jackie can you call patient and get her set up with a provider at your office.

## 2017-05-30 DIAGNOSIS — L218 Other seborrheic dermatitis: Secondary | ICD-10-CM | POA: Diagnosis not present

## 2017-06-20 ENCOUNTER — Ambulatory Visit: Payer: BLUE CROSS/BLUE SHIELD | Admitting: Family Medicine

## 2017-06-20 ENCOUNTER — Encounter: Payer: Self-pay | Admitting: Family Medicine

## 2017-06-20 VITALS — BP 118/72 | HR 71 | Temp 97.7°F | Ht 65.0 in | Wt 143.1 lb

## 2017-06-20 DIAGNOSIS — Z01818 Encounter for other preprocedural examination: Secondary | ICD-10-CM | POA: Diagnosis not present

## 2017-06-20 NOTE — Progress Notes (Signed)
Pre visit review using our clinic review tool, if applicable. No additional management support is needed unless otherwise documented below in the visit note. 

## 2017-06-20 NOTE — Progress Notes (Signed)
Subjective:   Chief Complaint  Patient presents with  . Needs a EKG for cosmetic surgery    Kathryn Price  is here for a Pre-operative physical at the request of Dr. Yehuda Mao.   She  is having Facelift surgery on 07/16/17 for cosmetic regions.  Personal or family hx of adverse outcome to anesthesia? No  Chipped, cracked, missing, or loose teeth? No  Decreased ROM of neck? No  Able to walk up 2 flights of stairs without becoming significantly short of breath or having chest pain? No   Revised Goldman Criteria: High Risk Surgery (intraperitoneal, intrathoracic, aortic): No  Ischemic heart disease (Prior MI, +excercise stress test, angina, nitrate use, Qwave): No  History of heart failure: No  History of cerebrovascular disease: No  History of diabetes: No  Insulin therapy for DM: No  Preoperative Cr >2.0: No   Revised Goldman Criteria - risk for major cardiac death No risk factors - 0.4 percent One risk factor - 1.0 percent  Two risk factors - 2.4 percent  Three or more risk factors - 5.4 percent   Patient Active Problem List   Diagnosis Date Noted  . Need for Tdap vaccination 11/04/2014  . Visit for preventive health examination 11/04/2014  . Breast cancer screening 11/04/2014   Past Medical History:  Diagnosis Date  . Chicken pox     Past Surgical History:  Procedure Laterality Date  . RECONSTRUCTION OF EYELID     cosmetic  . TONSILLECTOMY        No Known Allergies    Family History  Problem Relation Age of Onset  . Hypertension Mother   . Cancer Mother 67       breast   . Heart disease Mother   . Hypertension Father   . Heart disease Father      Review of Systems:  Constitutional:  no unexpected change in weight, no weakness, no unexplained fevers, sweats, or chills Eye:  no recent significant change in vision Ear:  no hearing loss Nose/Mouth/Throat:  No dental complaints Neck/Thyroid:  no lumps or masses Pulmonary:  no chronic cough, sputum, or hemoptysis  and no shortness of breath Cardiovascular:  no exercise intolerance, no chest pain Gastrointestinal:  no abdominal pain and no change in bowel habits GU:  negative for dysuria, frequency, and incontinence Musculoskeletal/Extremities:  no peripheral edema Skin/Integumentary ROS:  no abnormal skin lesions reported Neurologic:  no numbness, tingling, or tremor   Objective:   Vitals:   06/20/17 1407  BP: 118/72  Pulse: 71  Temp: 97.7 F (36.5 C)  TempSrc: Oral  SpO2: 93%  Weight: 143 lb 2 oz (64.9 kg)  Height: 5\' 5"  (1.651 m)   Body mass index is 23.82 kg/m.  General:  well developed, well nourished, in no apparent distress Skin:  warm, no pallor or diaphoresis Head:  normocephalic, atraumatic Eyes:  pupils equal and round, sclera anicteric without injection Ears:  canals without lesions, TMs shiny without retraction, no obvious effusion, no erythema Throat/Pharynx:  lips and gingiva without lesion; tongue and uvula midline; non-inflamed pharynx; no exudates or postnasal drainage Neck: neck supple without adenopathy, thyromegaly, or masses, no bruits, no jugular venous distention Lungs:  clear to auscultation, breath sounds equal bilaterally, no respiratory distress Cardio:  regular rate and rhythm without murmurs Abdomen:  abdomen soft, nontender; bowel sounds normal; no masses, hepatomegaly or splenomegaly Musculoskeletal:  symmetrical muscle groups noted without atrophy or deformity Extremities:  no clubbing, cyanosis, or edema, no deformities, no skin discoloration  Neuro:  gait normal; deep tendon reflexes normal and symmetric and alert and oriented to person, place, and time Psych: Age appropriate judgment and insight; normal mood   Assessment:   Pre-operative exam   Plan:   Orders as above. EKG - normal EKG, normal sinus rhythm The above laboratory work was ordered and will be sent with this physical. Will scan to her chart. She was also given a copy to bring to  his office.  The patient is deemed low cardiac risk for the proposed procedure. F/u for CPE at earliest convenience.  The patient voiced understanding and agreement to the plan.  Jilda Rocheicholas Paul [place], DO 06/20/17  2:21 PM

## 2017-06-20 NOTE — Patient Instructions (Addendum)
Your EKG looks normal.  There is nothing special you need to do from our end on the day of or the days leading up to surgery.  Let us know if you end up requiring labs and we can order them, no need to schedule an appointment to see a provider for this.  Let us know if you need anything.

## 2017-08-19 IMAGING — DX DG CHEST 2V
2 series · 2 of 2 positions shown · non-contrast
Comparison: None.

CLINICAL DATA: Chest congestion for several weeks, initial
encounter

EXAM:
CHEST  2 VIEW

[chest pa]
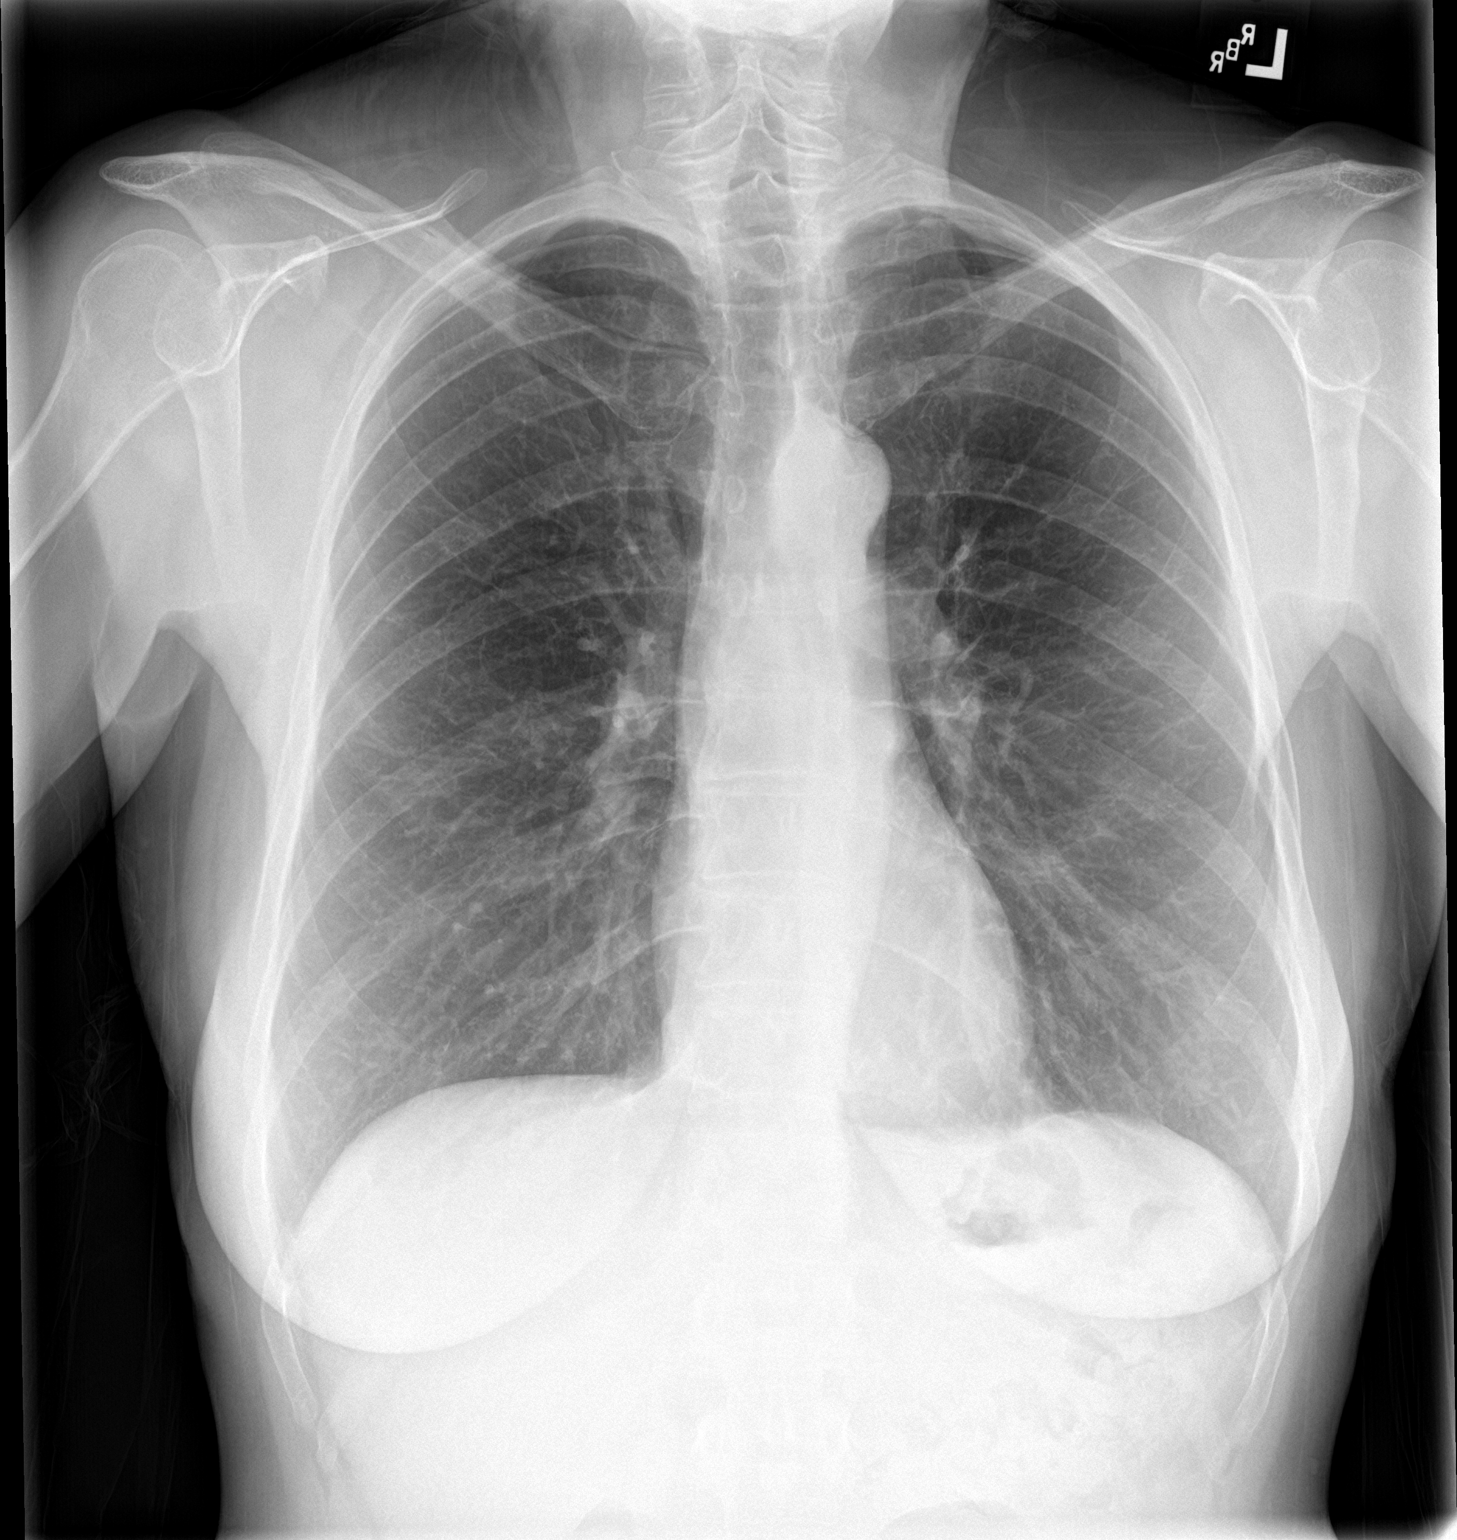

[chest lat]
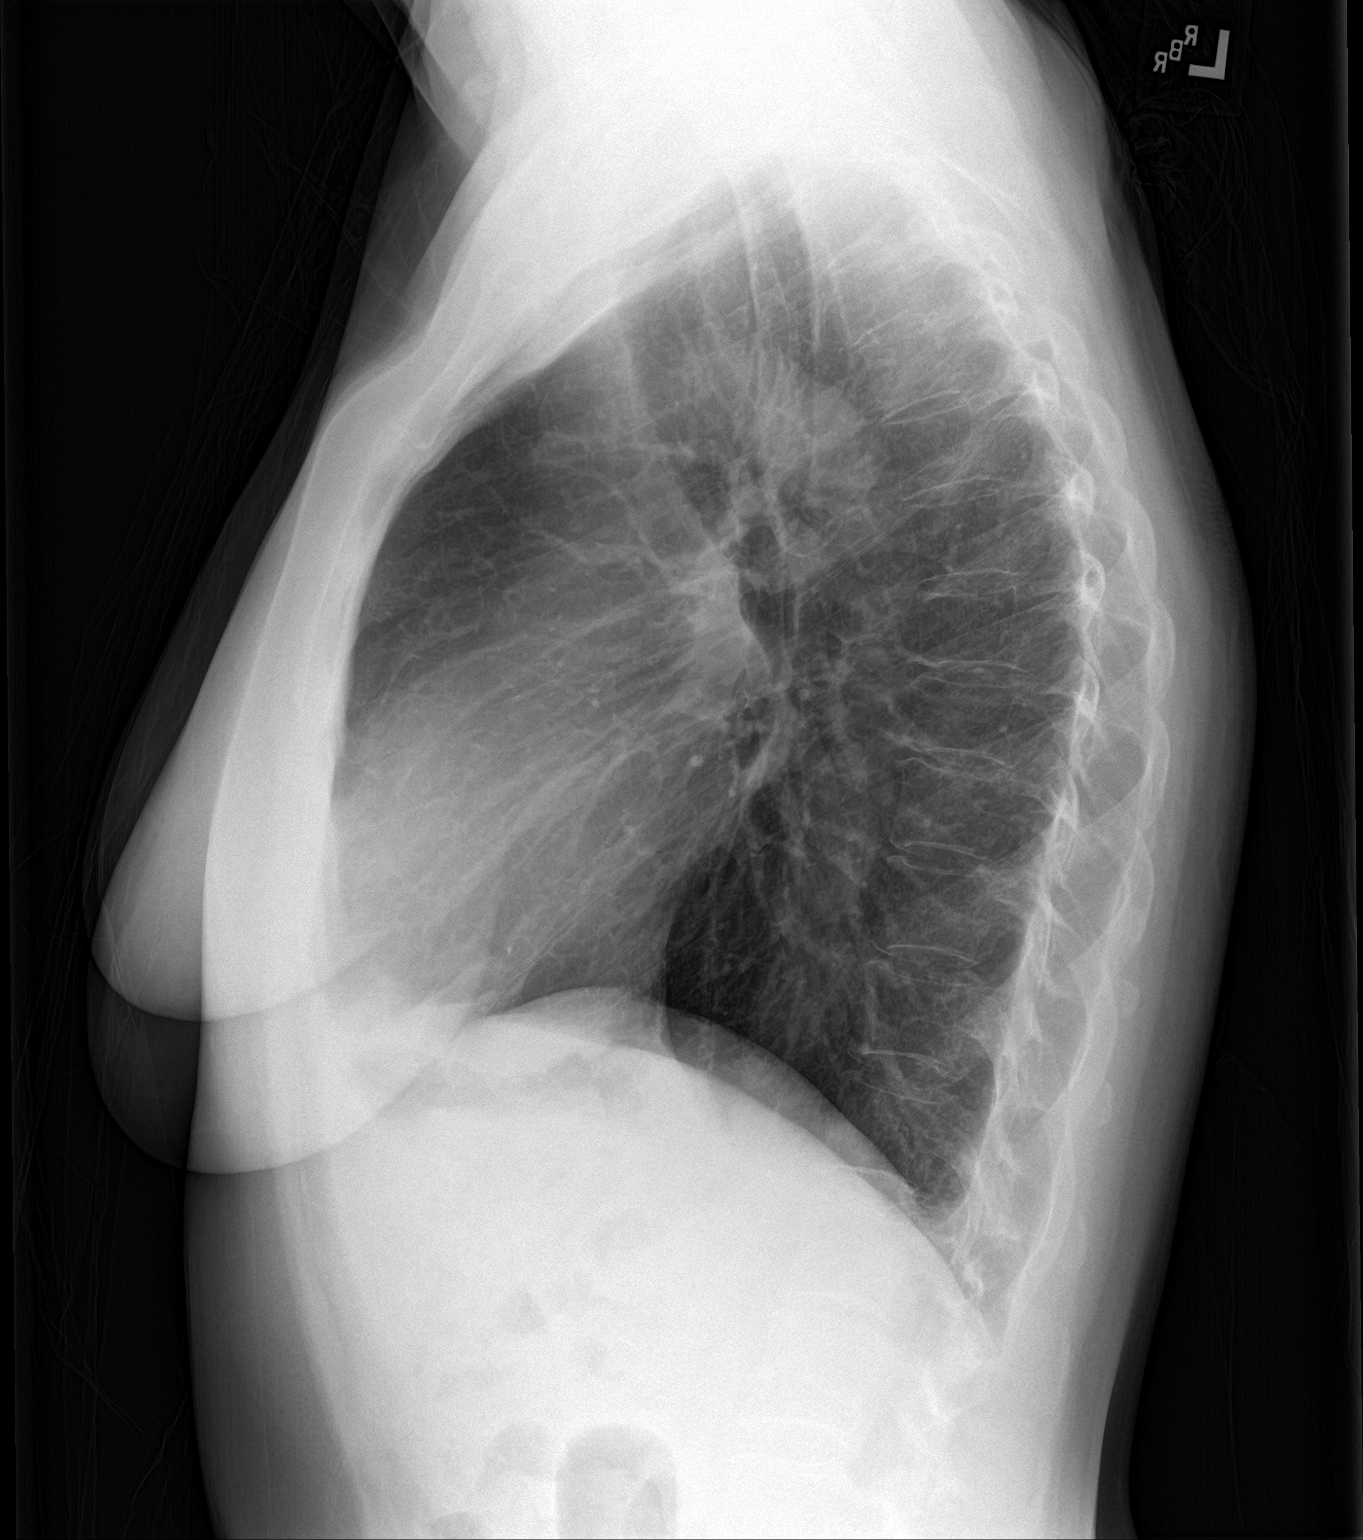

[2 of 2 positions shown; findings below may reference images not displayed]

FINDINGS: The heart size and mediastinal contours are within normal limits.
Both lungs are clear. The visualized skeletal structures are
unremarkable.
IMPRESSION: No active cardiopulmonary disease.

## 2018-11-04 DIAGNOSIS — E2839 Other primary ovarian failure: Secondary | ICD-10-CM | POA: Diagnosis not present

## 2018-11-04 DIAGNOSIS — E039 Hypothyroidism, unspecified: Secondary | ICD-10-CM | POA: Diagnosis not present

## 2023-10-29 ENCOUNTER — Other Ambulatory Visit: Payer: Self-pay | Admitting: Obstetrics

## 2023-10-29 ENCOUNTER — Ambulatory Visit
Admission: RE | Admit: 2023-10-29 | Discharge: 2023-10-29 | Disposition: A | Discharge status: Home / Self Care | Source: Ambulatory Visit | Attending: Obstetrics | Admitting: Obstetrics

## 2023-10-29 DIAGNOSIS — Z1231 Encounter for screening mammogram for malignant neoplasm of breast: Secondary | ICD-10-CM

## 2023-11-02 ENCOUNTER — Other Ambulatory Visit: Payer: Self-pay | Admitting: Obstetrics

## 2023-11-02 DIAGNOSIS — R928 Other abnormal and inconclusive findings on diagnostic imaging of breast: Secondary | ICD-10-CM

## 2023-11-07 ENCOUNTER — Other Ambulatory Visit

## 2023-11-07 ENCOUNTER — Encounter
